# Patient Record
Sex: Female | Born: 1970 | Race: White | Hispanic: No | Smoking: Current every day smoker
Health system: Southern US, Community
[De-identification: ages and names within clinical notes are randomized; demographics above are authoritative.]

## PROBLEM LIST (undated history)

## (undated) DIAGNOSIS — Z8619 Personal history of other infectious and parasitic diseases: Secondary | ICD-10-CM

## (undated) HISTORY — DX: Personal history of other infectious and parasitic diseases: Z86.19

---

## 1998-05-16 HISTORY — PX: TUBAL LIGATION: SHX77

## 2008-05-16 HISTORY — PX: CHOLECYSTECTOMY: SHX55

## 2011-09-21 HISTORY — PX: NECK SURGERY: SHX720

## 2012-11-16 ENCOUNTER — Emergency Department: Payer: Self-pay | Admitting: Emergency Medicine

## 2012-11-16 DIAGNOSIS — I10 Essential (primary) hypertension: Secondary | ICD-10-CM | POA: Insufficient documentation

## 2012-11-16 DIAGNOSIS — R9431 Abnormal electrocardiogram [ECG] [EKG]: Secondary | ICD-10-CM | POA: Insufficient documentation

## 2012-11-17 LAB — DRUG SCREEN, URINE
Amphetamines, Ur Screen: NEGATIVE (ref ?–1000)
Barbiturates, Ur Screen: NEGATIVE (ref ?–200)
Benzodiazepine, Ur Scrn: NEGATIVE (ref ?–200)
Cannabinoid 50 Ng, Ur ~~LOC~~: NEGATIVE (ref ?–50)
Cocaine Metabolite,Ur ~~LOC~~: NEGATIVE (ref ?–300)
MDMA (Ecstasy)Ur Screen: NEGATIVE (ref ?–500)

## 2012-11-17 LAB — URINALYSIS, COMPLETE
Blood: NEGATIVE
Glucose,UR: NEGATIVE mg/dL (ref 0–75)
Nitrite: NEGATIVE
RBC,UR: 1 /HPF (ref 0–5)

## 2012-11-17 LAB — COMPREHENSIVE METABOLIC PANEL
Alkaline Phosphatase: 102 U/L (ref 50–136)
Anion Gap: 8 (ref 7–16)
BUN: 10 mg/dL (ref 7–18)
Bilirubin,Total: 0.2 mg/dL (ref 0.2–1.0)
Calcium, Total: 8.5 mg/dL (ref 8.5–10.1)
Chloride: 108 mmol/L — ABNORMAL HIGH (ref 98–107)
Creatinine: 0.69 mg/dL (ref 0.60–1.30)
EGFR (Non-African Amer.): >60
Glucose: 103 mg/dL — ABNORMAL HIGH (ref 65–99)
Osmolality: 281 (ref 275–301)
SGPT (ALT): 17 U/L (ref 12–78)
Sodium: 141 mmol/L (ref 136–145)
Total Protein: 6.9 g/dL (ref 6.4–8.2)

## 2012-11-17 LAB — CBC
HCT: 37.1 % (ref 35.0–47.0)
HGB: 12.9 g/dL (ref 12.0–16.0)
MCH: 34.2 pg — ABNORMAL HIGH (ref 26.0–34.0)
MCV: 98 fL (ref 80–100)
Platelet: 293 10*3/uL (ref 150–440)
RBC: 3.79 10*6/uL — ABNORMAL LOW (ref 3.80–5.20)
RDW: 13.9 % (ref 11.5–14.5)

## 2012-11-17 LAB — PREGNANCY, URINE: Pregnancy Test, Urine: NEGATIVE m[IU]/mL

## 2012-11-17 LAB — ETHANOL
Ethanol %: <0.003 % (ref 0.000–0.080)
Ethanol: <3 mg/dL

## 2012-11-17 LAB — TROPONIN I: Troponin-I: <0.02 ng/mL

## 2012-11-17 LAB — TSH: Thyroid Stimulating Horm: 0.956 u[IU]/mL

## 2012-12-12 ENCOUNTER — Ambulatory Visit (INDEPENDENT_AMBULATORY_CARE_PROVIDER_SITE_OTHER): Payer: Self-pay | Admitting: Cardiovascular Disease

## 2012-12-12 ENCOUNTER — Encounter: Payer: Self-pay | Admitting: Cardiovascular Disease

## 2012-12-12 VITALS — BP 168/98 | HR 85 | Ht 63.0 in | Wt 149.5 lb

## 2012-12-12 DIAGNOSIS — F411 Generalized anxiety disorder: Secondary | ICD-10-CM

## 2012-12-12 DIAGNOSIS — F419 Anxiety disorder, unspecified: Secondary | ICD-10-CM

## 2012-12-12 DIAGNOSIS — R079 Chest pain, unspecified: Secondary | ICD-10-CM

## 2012-12-12 DIAGNOSIS — R0602 Shortness of breath: Secondary | ICD-10-CM

## 2012-12-12 DIAGNOSIS — I10 Essential (primary) hypertension: Secondary | ICD-10-CM

## 2012-12-12 MED ORDER — LISINOPRIL 20 MG PO TABS: 20.0000 mg | ORAL_TABLET | Freq: Every day | ORAL | Status: DC

## 2012-12-12 MED ORDER — AMLODIPINE BESYLATE 10 MG PO TABS: 10.0000 mg | ORAL_TABLET | Freq: Every day | ORAL | Status: DC

## 2012-12-12 NOTE — Patient Instructions (Addendum)
Please start amlodipine 1/2 pill a day for 5 days If blood pressure continues to run high,  Increase to a full pill  If blood pressure continues to run high, Start lisinopril one a day Track you blood pressure numbers   Please call us if you have new issues that need to be addressed before your next appt.  Your physician wants you to follow-up in: 1 month.

## 2012-12-12 NOTE — Assessment & Plan Note (Addendum)
Atypical type chest pain. Likely from hypertension and underlying stress/anxiety Records were reviewed from [facility]

## 2012-12-12 NOTE — Progress Notes (Signed)
   Patient ID: Alyssa Fleming, female    DOB: [birth year], 42 y.o.   MRN: [id number]  HPI Comments: 42 year old woman who recently moved from [place] to take care of her sister who is disabled who presents to the clinic to establish care  She reports recent evaluation in the emergency room on 11/16/2012 for chest pain, numbness in the lips. She has significant anxiety and stress taking care of her family member. D-dimer was negative, cardiac enzymes negative, cocci negative, urinalysis negative, potassium 3.2 with normal basic metabolic panel, no alcohol, normal CBC and chest x-ray negative. CT scan of the head was essentially normal. She reports that her blood pressure continues to run very high. She's not on any medications. Blood pressure was never a problem before in [place] now has been a chronic issue. She reports systolic pressures in the 160-180 range on a regular basis.   She does not have any chest pain with exertion. She is concerned about the numbness around her mouth. Ativan and Xanax seems to help her symptoms. She does not have a primary care physician yet.  EKG shows normal sinus rhythm with rate 85 beats per minute, no significant ST or T wave changes     Outpatient Encounter Prescriptions as of 12/12/2012  Medication Sig Dispense Refill  . aspirin 81 MG tablet Take 81 mg by mouth daily.      Marland Kitchen ibuprofen (ADVIL,MOTRIN) 200 MG tablet Take 200 mg by mouth every 6 (six) hours as needed for pain.      Marland Kitchen LORazepam (ATIVAN) 0.5 MG tablet Take 0.5 mg by mouth every 8 (eight) hours.        Review of Systems  Constitutional: Negative.   HENT: Negative.   Eyes: Negative.   Respiratory: Positive for chest tightness.   Cardiovascular: Negative.   Gastrointestinal: Negative.   Musculoskeletal: Negative.   Skin: Negative.   Neurological: Negative.   Psychiatric/Behavioral: Positive for sleep disturbance. The patient is nervous/anxious.   All other systems reviewed and are  negative.    BP 168/98  Pulse 85  Ht 5\' 3"  (1.6 m)  Wt 149 lb 8 oz (67.813 kg)  BMI 26.49 kg/m2  Physical Exam  Nursing note and vitals reviewed. Constitutional: She is oriented to person, place, and time. She appears well-developed and well-nourished.  HENT:  Head: Normocephalic.  Nose: Nose normal.  Mouth/Throat: Oropharynx is clear and moist.  Eyes: Conjunctivae are normal. Pupils are equal, round, and reactive to light.  Neck: Normal range of motion. Neck supple. No JVD present.  Cardiovascular: Normal rate, regular rhythm, S1 normal, S2 normal, normal heart sounds and intact distal pulses.  Exam reveals no gallop and no friction rub.   No murmur heard. Pulmonary/Chest: Effort normal and breath sounds normal. No respiratory distress. She has no wheezes. She has no rales. She exhibits no tenderness.  Abdominal: Soft. Bowel sounds are normal. She exhibits no distension. There is no tenderness.  Musculoskeletal: Normal range of motion. She exhibits no edema and no tenderness.  Lymphadenopathy:    She has no cervical adenopathy.  Neurological: She is alert and oriented to person, place, and time. Coordination normal.  Skin: Skin is warm and dry. No rash noted. No erythema.  Psychiatric: She has a normal mood and affect. Her behavior is normal. Judgment and thought content normal.    Assessment and Plan

## 2012-12-12 NOTE — Assessment & Plan Note (Signed)
Blood pressure very elevated today and recently. Stress likely contributing. We'll start amlodipine 5 mg titrating up to 10 mg daily. She will check her blood pressure at home as she does have access to a blood pressure cuff. If he continues to run high, she will start lisinopril daily.

## 2012-12-12 NOTE — Assessment & Plan Note (Signed)
Significant stress and anxiety at home taking care of a disabled sister.

## 2013-01-14 ENCOUNTER — Ambulatory Visit (INDEPENDENT_AMBULATORY_CARE_PROVIDER_SITE_OTHER): Payer: Self-pay | Admitting: Cardiovascular Disease

## 2013-01-14 ENCOUNTER — Encounter: Payer: Self-pay | Admitting: Cardiovascular Disease

## 2013-01-14 VITALS — BP 140/92 | HR 82 | Ht 63.0 in | Wt 149.5 lb

## 2013-01-14 DIAGNOSIS — R5383 Other fatigue: Secondary | ICD-10-CM

## 2013-01-14 DIAGNOSIS — R5381 Other malaise: Secondary | ICD-10-CM

## 2013-01-14 DIAGNOSIS — IMO0001 Reserved for inherently not codable concepts without codable children: Secondary | ICD-10-CM

## 2013-01-14 DIAGNOSIS — R0602 Shortness of breath: Secondary | ICD-10-CM

## 2013-01-14 DIAGNOSIS — I10 Essential (primary) hypertension: Secondary | ICD-10-CM

## 2013-01-14 DIAGNOSIS — F411 Generalized anxiety disorder: Secondary | ICD-10-CM

## 2013-01-14 DIAGNOSIS — F419 Anxiety disorder, unspecified: Secondary | ICD-10-CM

## 2013-01-14 DIAGNOSIS — F17200 Nicotine dependence, unspecified, uncomplicated: Secondary | ICD-10-CM

## 2013-01-14 DIAGNOSIS — R079 Chest pain, unspecified: Secondary | ICD-10-CM

## 2013-01-14 MED ORDER — AMLODIPINE BESYLATE 10 MG PO TABS: 10.0000 mg | ORAL_TABLET | Freq: Every day | ORAL | Status: DC

## 2013-01-14 NOTE — Assessment & Plan Note (Signed)
Blood pressure seems to be better controlled on a half pill of amlodipine, sometimes needs extra dose if it runs high. We have suggested she try to treat her headache and neck pain as well as this could cause her blood pressure to run high.

## 2013-01-14 NOTE — Assessment & Plan Note (Signed)
Atypical episodes of chest pain. No further workup at this time

## 2013-01-14 NOTE — Progress Notes (Signed)
   Patient ID: Alyssa Fleming, female    DOB: 05/03/1971, 42 y.o.   MRN: [id number]  HPI Comments: 42 year old woman who recently moved from [place] to take care of her sister who is disabled who presents to the clinic to establish care  She had evaluation in the emergency room on 11/16/2012 for chest pain, numbness in the lips. She has significant anxiety and stress taking care of her family member. D-dimer was negative, cardiac enzymes negative, cocci negative, urinalysis negative, potassium 3.2 with normal basic metabolic panel, no alcohol, normal CBC and chest x-ray negative. CT scan of the head was essentially normal.   She was started on amlodipine for blood pressure, continues to take aspirin. She has neck pain, headaches that seem to push her blood pressure higher. Sometimes when she has a headache, she takes 3 amlodipine pills. No further significant chest pain episodes apart from atypical type symptoms at rest. She try to establish care with Dr. Sherrie Mustache. She reports that she has not been taking Ativan.  EKG shows normal sinus rhythm with rate 82beats per minute, no significant ST or T wave changes     Outpatient Encounter Prescriptions as of 01/14/2013  Medication Sig Dispense Refill  . acetaminophen (TYLENOL) 325 MG tablet Take 325 mg by mouth every 6 (six) hours as needed for pain.       Marland Kitchen amLODipine (NORVASC) 10 MG tablet Take 1 tablet (10 mg total) by mouth daily.sometimes takes a half pill   30 tablet  6  . aspirin 81 MG tablet Take 81 mg by mouth daily.        Review of Systems  Constitutional: Negative.   HENT: Negative.   Eyes: Negative.   Cardiovascular: Negative.   Gastrointestinal: Negative.   Musculoskeletal: Negative.   Skin: Negative.   Neurological: Negative.   Psychiatric/Behavioral: Positive for sleep disturbance. The patient is nervous/anxious.   All other systems reviewed and are negative.    BP 140/92  Pulse 82  Ht 5\' 3"  (1.6 m)  Wt 149 lb 8 oz (67.813  kg)  BMI 26.49 kg/m2  Physical Exam  Nursing note and vitals reviewed. Constitutional: She is oriented to person, place, and time. She appears well-developed and well-nourished.  HENT:  Head: Normocephalic.  Nose: Nose normal.  Mouth/Throat: Oropharynx is clear and moist.  Eyes: Conjunctivae are normal. Pupils are equal, round, and reactive to light.  Neck: Normal range of motion. Neck supple. No JVD present.  Cardiovascular: Normal rate, regular rhythm, S1 normal, S2 normal, normal heart sounds and intact distal pulses.  Exam reveals no gallop and no friction rub.   No murmur heard. Pulmonary/Chest: Effort normal and breath sounds normal. No respiratory distress. She has no wheezes. She has no rales. She exhibits no tenderness.  Abdominal: Soft. Bowel sounds are normal. She exhibits no distension. There is no tenderness.  Musculoskeletal: Normal range of motion. She exhibits no edema and no tenderness.  Lymphadenopathy:    She has no cervical adenopathy.  Neurological: She is alert and oriented to person, place, and time. Coordination normal.  Skin: Skin is warm and dry. No rash noted. No erythema.  Psychiatric: She has a normal mood and affect. Her behavior is normal. Judgment and thought content normal.    Assessment and Plan

## 2013-01-14 NOTE — Assessment & Plan Note (Signed)
History of anxiety by her report.

## 2013-01-14 NOTE — Patient Instructions (Addendum)
You are doing well. No medication changes were made.  Please call us if you have new issues that need to be addressed before your next appt.    

## 2013-01-14 NOTE — Assessment & Plan Note (Signed)
She smokes one quarter pack per day. We have encouraged her to continue to work on weaning her cigarettes and smoking cessation. She will continue to work on this and does not want any assistance with chantix.

## 2013-03-03 LAB — CBC AND DIFFERENTIAL
HCT: 40 % (ref 36–46)
Hemoglobin: 13.3 g/dL (ref 12.0–16.0)
Platelets: 431 10*3/uL — AB (ref 150–399)
WBC: 12.8 10^3/mL

## 2013-03-03 LAB — TSH: TSH: 0.89 u[IU]/mL (ref 0.41–5.90)

## 2013-03-03 LAB — HM PAP SMEAR: HM PAP: NEGATIVE

## 2013-10-30 ENCOUNTER — Emergency Department: Payer: Self-pay | Admitting: Emergency Medicine

## 2013-12-17 ENCOUNTER — Emergency Department: Payer: Self-pay | Admitting: Emergency Medicine

## 2013-12-17 LAB — COMPREHENSIVE METABOLIC PANEL
AST: 28 U/L (ref 15–37)
Albumin: 3.5 g/dL (ref 3.4–5.0)
Alkaline Phosphatase: 81 U/L
Anion Gap: 4 — ABNORMAL LOW (ref 7–16)
BUN: 8 mg/dL (ref 7–18)
Bilirubin,Total: 0.2 mg/dL (ref 0.2–1.0)
CALCIUM: 8.2 mg/dL — AB (ref 8.5–10.1)
Chloride: 110 mmol/L — ABNORMAL HIGH (ref 98–107)
Co2: 26 mmol/L (ref 21–32)
Creatinine: 0.87 mg/dL (ref 0.60–1.30)
EGFR (African American): >60
EGFR (Non-African Amer.): >60
Glucose: 108 mg/dL — ABNORMAL HIGH (ref 65–99)
OSMOLALITY: 278 (ref 275–301)
POTASSIUM: 3.9 mmol/L (ref 3.5–5.1)
SGPT (ALT): 16 U/L (ref 12–78)
SODIUM: 140 mmol/L (ref 136–145)
Total Protein: 7 g/dL (ref 6.4–8.2)

## 2013-12-17 LAB — CBC
HCT: 42.4 % (ref 35.0–47.0)
HGB: 14.3 g/dL (ref 12.0–16.0)
MCH: 34.1 pg — ABNORMAL HIGH (ref 26.0–34.0)
MCHC: 33.8 g/dL (ref 32.0–36.0)
MCV: 101 fL — ABNORMAL HIGH (ref 80–100)
PLATELETS: 309 10*3/uL (ref 150–440)
RBC: 4.2 10*6/uL (ref 3.80–5.20)
RDW: 13.9 % (ref 11.5–14.5)
WBC: 10.2 10*3/uL (ref 3.6–11.0)

## 2013-12-17 LAB — PRO B NATRIURETIC PEPTIDE: B-Type Natriuretic Peptide: 50 pg/mL (ref 0–125)

## 2013-12-17 LAB — TROPONIN I

## 2014-07-30 ENCOUNTER — Emergency Department: Payer: Self-pay | Admitting: Emergency Medicine

## 2014-07-30 LAB — URINALYSIS, COMPLETE
BILIRUBIN, UR: NEGATIVE
BLOOD: NEGATIVE
Bacteria: NONE SEEN
Glucose,UR: NEGATIVE mg/dL (ref 0–75)
Leukocyte Esterase: NEGATIVE
NITRITE: NEGATIVE
PH: 6 (ref 4.5–8.0)
Protein: NEGATIVE
RBC,UR: 1 /HPF (ref 0–5)
Specific Gravity: 1.009 (ref 1.003–1.030)
Squamous Epithelial: 2
WBC UR: 1 /HPF (ref 0–5)

## 2014-07-30 LAB — COMPREHENSIVE METABOLIC PANEL
ALK PHOS: 124 U/L — AB
ANION GAP: 6 — AB (ref 7–16)
Albumin: 4.1 g/dL (ref 3.4–5.0)
BILIRUBIN TOTAL: 0.3 mg/dL (ref 0.2–1.0)
BUN: 9 mg/dL (ref 7–18)
CHLORIDE: 106 mmol/L (ref 98–107)
CO2: 25 mmol/L (ref 21–32)
CREATININE: 0.72 mg/dL (ref 0.60–1.30)
Calcium, Total: 8.9 mg/dL (ref 8.5–10.1)
EGFR (African American): >60
EGFR (Non-African Amer.): >60
Glucose: 107 mg/dL — ABNORMAL HIGH (ref 65–99)
Osmolality: 273 (ref 275–301)
POTASSIUM: 4.7 mmol/L (ref 3.5–5.1)
SGOT(AST): 55 U/L — ABNORMAL HIGH (ref 15–37)
SGPT (ALT): 67 U/L — ABNORMAL HIGH
SODIUM: 137 mmol/L (ref 136–145)
Total Protein: 7.7 g/dL (ref 6.4–8.2)

## 2014-07-30 LAB — LIPASE, BLOOD: Lipase: 171 U/L (ref 73–393)

## 2014-07-30 LAB — CBC
HCT: 43.3 % (ref 35.0–47.0)
HGB: 14.2 g/dL (ref 12.0–16.0)
MCH: 33.3 pg (ref 26.0–34.0)
MCHC: 32.8 g/dL (ref 32.0–36.0)
MCV: 101 fL — AB (ref 80–100)
PLATELETS: 361 10*3/uL (ref 150–440)
RBC: 4.28 10*6/uL (ref 3.80–5.20)
RDW: 14 % (ref 11.5–14.5)
WBC: 11.7 10*3/uL — ABNORMAL HIGH (ref 3.6–11.0)

## 2014-12-17 ENCOUNTER — Other Ambulatory Visit: Payer: Self-pay | Admitting: Family Medicine

## 2014-12-23 ENCOUNTER — Other Ambulatory Visit: Payer: Self-pay | Admitting: Family Medicine

## 2014-12-23 NOTE — Telephone Encounter (Signed)
Pt contacted office for refill request on the following medications:Oxycodone 10-325mg .  [phone]

## 2014-12-24 MED ORDER — OXYCODONE-ACETAMINOPHEN 10-325 MG PO TABS: 1.0000 | ORAL_TABLET | Freq: Three times a day (TID) | ORAL | Status: DC | PRN

## 2015-01-18 ENCOUNTER — Other Ambulatory Visit: Payer: Self-pay | Admitting: Family Medicine

## 2015-01-18 DIAGNOSIS — G8929 Other chronic pain: Secondary | ICD-10-CM

## 2015-01-18 DIAGNOSIS — M542 Cervicalgia: Principal | ICD-10-CM

## 2015-01-18 MED ORDER — OXYCODONE-ACETAMINOPHEN 10-325 MG PO TABS: 1.0000 | ORAL_TABLET | Freq: Three times a day (TID) | ORAL | Status: DC | PRN

## 2015-01-18 NOTE — Telephone Encounter (Signed)
Pt contacted office for refill request on the following medications:  oxyCODONE-acetaminophen (PERCOCET) 10-325 MG.  ZO#[phone]/WJB#[phone]/MJ This is a Dr Sherrie MustacheFisher pt.

## 2015-01-18 NOTE — Telephone Encounter (Signed)
Last refill 12/24/2014. Allene DillonEmily Drozdowski, CMA

## 2015-01-18 NOTE — Telephone Encounter (Signed)
Prescription printed. Please notify patient it is ready for pick up. Thanks- Dr. Dajour Pierpoint.  

## 2015-01-26 ENCOUNTER — Emergency Department
Admission: EM | Admit: 2015-01-26 | Discharge: 2015-01-26 | Disposition: A | Discharge status: Home / Self Care | Payer: Self-pay | Attending: Emergency Medicine | Admitting: Emergency Medicine

## 2015-01-26 ENCOUNTER — Encounter: Payer: Self-pay | Admitting: Emergency Medicine

## 2015-01-26 DIAGNOSIS — M5416 Radiculopathy, lumbar region: Secondary | ICD-10-CM | POA: Insufficient documentation

## 2015-01-26 DIAGNOSIS — M62830 Muscle spasm of back: Secondary | ICD-10-CM

## 2015-01-26 DIAGNOSIS — Z79899 Other long term (current) drug therapy: Secondary | ICD-10-CM | POA: Insufficient documentation

## 2015-01-26 DIAGNOSIS — I10 Essential (primary) hypertension: Secondary | ICD-10-CM | POA: Insufficient documentation

## 2015-01-26 DIAGNOSIS — Z720 Tobacco use: Secondary | ICD-10-CM | POA: Insufficient documentation

## 2015-01-26 MED ORDER — PREDNISONE 10 MG PO TABS: ORAL_TABLET | ORAL | Status: DC

## 2015-01-26 MED ORDER — CYCLOBENZAPRINE HCL 5 MG PO TABS: 5.0000 mg | ORAL_TABLET | Freq: Three times a day (TID) | ORAL | Status: DC | PRN

## 2015-01-26 MED ORDER — DIAZEPAM 5 MG/ML IJ SOLN
5.0000 mg | Freq: Once | INTRAMUSCULAR | Status: AC
  Administered 2015-01-26: 5 mg via INTRAMUSCULAR
  Filled 2015-01-26: qty 2

## 2015-01-26 MED ORDER — DEXAMETHASONE SODIUM PHOSPHATE 10 MG/ML IJ SOLN
10.0000 mg | Freq: Once | INTRAMUSCULAR | Status: AC
  Administered 2015-01-26: 10 mg via INTRAMUSCULAR
  Filled 2015-01-26: qty 1

## 2015-01-26 NOTE — ED Notes (Signed)
Pt reports lower back pain x3 days; reports hx of lower back pain. Pt denies any injury. Pt denies any urinary symptoms.

## 2015-01-26 NOTE — ED Notes (Signed)
Pt. States chronic lower rt. Back pain.  Pt. States difficulty sitting.  Pt. States taking mobic today along with flexeril.  Pt. States no relief today.

## 2015-01-26 NOTE — Discharge Instructions (Signed)
Back Exercises Back exercises help treat and prevent back injuries. The goal of back exercises is to increase the strength of your abdominal and back muscles and the flexibility of your back. These exercises should be started when you no longer have back pain. Back exercises include:  Pelvic Tilt. Lie on your back with your knees bent. Tilt your pelvis until the lower part of your back is against the floor. Hold this position 5 to 10 sec and repeat 5 to 10 times.  Knee to Chest. Pull first 1 knee up against your chest and hold for 20 to 30 seconds, repeat this with the other knee, and then both knees. This may be done with the other leg straight or bent, whichever feels better.  Sit-Ups or Curl-Ups. Bend your knees 90 degrees. Start with tilting your pelvis, and do a partial, slow sit-up, lifting your trunk only 30 to 45 degrees off the floor. Take at least 2 to 3 seconds for each sit-up. Do not do sit-ups with your knees out straight. If partial sit-ups are difficult, simply do the above but with only tightening your abdominal muscles and holding it as directed.  Hip-Lift. Lie on your back with your knees flexed 90 degrees. Push down with your feet and shoulders as you raise your hips a couple inches off the floor; hold for 10 seconds, repeat 5 to 10 times.  Back arches. Lie on your stomach, propping yourself up on bent elbows. Slowly press on your hands, causing an arch in your low back. Repeat 3 to 5 times. Any initial stiffness and discomfort should lessen with repetition over time.  Shoulder-Lifts. Lie face down with arms beside your body. Keep hips and torso pressed to floor as you slowly lift your head and shoulders off the floor. Do not overdo your exercises, especially in the beginning. Exercises may cause you some mild back discomfort which lasts for a few minutes; however, if the pain is more severe, or lasts for more than 15 minutes, do not continue exercises until you see your caregiver.  Improvement with exercise therapy for back problems is slow.  See your caregivers for assistance with developing a proper back exercise program. Document Released: 08/09/2004 Document Revised: 09/24/2011 Document Reviewed: 05/03/2011 [facility] Patient Information 2015 [place], [place]. This information is not intended to replace advice given to you by your health care provider. Make sure you discuss any questions you have with your health care provider.  Lumbosacral Radiculopathy Lumbosacral radiculopathy is a pinched nerve or nerves in the low back (lumbosacral area). When this happens you may have weakness in your legs and may not be able to stand on your toes. You may have pain going down into your legs. There may be difficulties with walking normally. There are many causes of this problem. Sometimes this may happen from an injury, or simply from arthritis or boney problems. It may also be caused by other illnesses such as diabetes. If there is no improvement after treatment, further studies may be done to find the exact cause. DIAGNOSIS  X-rays may be needed if the problems become long standing. Electromyograms may be done. This study is one in which the working of nerves and muscles is studied. HOME CARE INSTRUCTIONS   Applications of ice packs may be helpful. Ice can be used in a plastic bag with a towel around it to prevent frostbite to skin. This may be used every 2 hours for 20 to 30 minutes, or as needed, while awake, or  as directed by your caregiver.  Only take over-the-counter or prescription medicines for pain, discomfort, or fever as directed by your caregiver.  If physical therapy was prescribed, follow your caregiver's directions. SEEK IMMEDIATE MEDICAL CARE IF:   You have pain not controlled with medications.  You seem to be getting worse rather than better.  You develop increasing weakness in your legs.  You develop loss of bowel or bladder control.  You have difficulty with  walking or balance, or develop clumsiness in the use of your legs.  You have a fever. MAKE SURE YOU:   Understand these instructions.  Will watch your condition.  Will get help right away if you are not doing well or get worse. Document Released: 07/02/2005 Document Revised: 09/24/2011 Document Reviewed: 02/20/2008 [facility] Patient Information 2015 [place]. This information is not intended to replace advice given to you by your health care provider. Make sure you discuss any questions you have with your health care provider.

## 2015-01-26 NOTE — ED Provider Notes (Signed)
CSN: [id number]     Arrival date & time 01/26/15  1825 History   First MD Initiated Contact with Patient 01/26/15 2106     Chief Complaint  Patient presents with  . Back Pain     (Consider location/radiation/quality/duration/timing/severity/associated sxs/prior Treatment) HPI  44 year old female presents today for evaluation of lower back pain muscle spasms with right-sided radicular symptoms. She's had pain numbness and tingling that is sharp radiating down her right posterior thigh to the knee. She has occasional numbness in the right foot. The symptoms of the present for the last 3-4 days and have been reoccurring every 4 weeks for the last 3 months. She denies any trauma or injury. Chronic oxycodone 10 mg for chronic neck pain. She has been taking meloxicam with minimal to no relief. Her pain is severe and increased with activity. She gets relief with rest. With walking she will have Itching and tightness in the right lower back. No weakness or loss of bowel or bladder symptoms.  Past Medical History  Diagnosis Date  . Hypertension   . Chronic bronchitis   . GERD (gastroesophageal reflux disease)    Past Surgical History  Procedure Laterality Date  . Cesarean section    . Tubal ligation    . Cholecystectomy    . Neck surgery  09/21/2011   Family History  Problem Relation Age of Onset  . Heart disease Mother   . Hypertension Mother   . Hypertension Sister    History  Substance Use Topics  . Smoking status: Current Every Day Smoker -- 1.00 packs/day for 20 years    Types: Cigarettes  . Smokeless tobacco: Not on file  . Alcohol Use: 0.5 oz/week    1 Standard drinks or equivalent per week     Comment: occasional   OB History    No data available     Review of Systems  Constitutional: Negative for fever, chills, activity change and fatigue.  HENT: Negative for congestion, sinus pressure and sore throat.   Eyes: Negative for visual disturbance.  Respiratory: Negative for  cough, chest tightness and shortness of breath.   Cardiovascular: Negative for chest pain and leg swelling.  Gastrointestinal: Negative for nausea, vomiting, abdominal pain and diarrhea.  Genitourinary: Negative for dysuria.  Musculoskeletal: Positive for back pain. Negative for arthralgias and gait problem.  Skin: Negative for rash.  Neurological: Positive for numbness. Negative for weakness and headaches.  Hematological: Negative for adenopathy.  Psychiatric/Behavioral: Negative for behavioral problems, confusion and agitation.      Allergies  Toradol and Tramadol  Home Medications   Prior to Admission medications   Medication Sig Start Date End Date Taking? Authorizing Provider  acetaminophen (TYLENOL) 325 MG tablet Take 325 mg by mouth every 6 (six) hours as needed for pain.     Historical Provider, MD  amLODipine (NORVASC) 10 MG tablet Take 1 tablet (10 mg total) by mouth daily. 01/14/13   Antonieta Iba, MD  aspirin 81 MG tablet Take 81 mg by mouth daily.    Historical Provider, MD  cyclobenzaprine (FLEXERIL) 5 MG tablet Take 1 tablet (5 mg total) by mouth every 8 (eight) hours as needed for muscle spasms. 01/26/15   Evon Slack, PA-C  lisinopril (PRINIVIL,ZESTRIL) 10 MG tablet take 1 tablet by mouth every evening 12/17/14   Malva Limes, MD  oxyCODONE-acetaminophen (PERCOCET) 10-325 MG per tablet Take 1 tablet by mouth every 8 (eight) hours as needed for pain. 01/18/15   Lorie Phenix, MD  predniSONE (DELTASONE) 10 MG tablet 10 day taper. 5,5,4,4,3,3,2,2,1,1 01/26/15   Evon Slackhomas C Natiya Seelinger, PA-C   BP 158/89 mmHg  Pulse 95  Temp(Src) 97.8 F (36.6 C) (Oral)  Resp 18  Ht 5\' 3"  (1.6 m)  Wt 145 lb (65.772 kg)  BMI 25.69 kg/m2  SpO2 100%  LMP 01/18/2015 (Approximate) Physical Exam  Constitutional: She is oriented to person, place, and time. She appears well-developed and well-nourished. No distress.  HENT:  Head: Normocephalic and atraumatic.  Mouth/Throat: Oropharynx is clear  and moist.  Eyes: EOM are normal. Pupils are equal, round, and reactive to light. Right eye exhibits no discharge. Left eye exhibits no discharge.  Neck: Normal range of motion. Neck supple.  Cardiovascular: Normal rate and intact distal pulses.   Pulmonary/Chest: Effort normal. No respiratory distress. She exhibits no tenderness.  Abdominal: Soft. Bowel sounds are normal. She exhibits no distension and no mass. There is no tenderness.  Musculoskeletal:  Patient able to stand with no significant discomfort. Examination of the lumbar spine shows patient has no spinous process tenderness. She has right paravertebral muscle tenderness of muscle spasm noted. She has limited flexion and extension of the lumbar spine. She has full range of motion of the hips knees and ankles. 5 out of 5 strength with hip flexion, knee flexion and extension, ankle plantarflexion dorsiflexion. Neurovascularly intact in bilateral lower extremity is. 2+ patellar reflexes bilaterally. normal sensation in bilateral lower extremities.  Neurological: She is alert and oriented to person, place, and time. She has normal reflexes.  Skin: Skin is warm and dry.  Psychiatric: She has a normal mood and affect. Her behavior is normal. Thought content normal.  Nursing note and vitals reviewed.   ED Course  Procedures (including critical care time) Labs Review Labs Reviewed - No data to display  Imaging Review No results found.   EKG Interpretation None      MDM   Final diagnoses:  Right lumbar radiculopathy  Lumbar paraspinal muscle spasm    44 year old female with acute on chronic right lumbar radiculopathy. No neurological deficits on exam. She is on chronic Percocet for her chronic neck pain. She was found to have significant muscle spasm present in the right lumbar spine. She was given injection of 5 mg of Valium IM followed by 10 mg of dexamethasone IM. She is given a prescription for 10 day prednisone taper as well  as Flexeril 5 mg 3 times a day. She will follow-up with PCP or Dr. Rosita Keamenz for evaluation in 5-7 days if no improvement of symptoms. He turned to the ER for any worsening symptoms or urgent changes in her health.    Evon Slackhomas C Morena Mckissack, PA-C 01/26/15 2125  Sharman CheekPhillip Stafford, MD 01/26/15 [phone]

## 2015-02-03 ENCOUNTER — Telehealth: Payer: Self-pay | Admitting: Family Medicine

## 2015-02-03 NOTE — Telephone Encounter (Signed)
She got a letter for jury duty.  She wants to know if you can write her a note due to some medication that she is on.  Please call her back at [phone] to let her know. This is a number at someone else home that she is at today.  Thanks, [employer]

## 2015-02-03 NOTE — Telephone Encounter (Signed)
Left message for pt to cb. Letter readyto pick-up.

## 2015-02-09 ENCOUNTER — Telehealth: Payer: Self-pay | Admitting: Family Medicine

## 2015-02-09 DIAGNOSIS — G8929 Other chronic pain: Secondary | ICD-10-CM

## 2015-02-09 DIAGNOSIS — M542 Cervicalgia: Principal | ICD-10-CM

## 2015-02-09 MED ORDER — OXYCODONE-ACETAMINOPHEN 10-325 MG PO TABS: 1.0000 | ORAL_TABLET | Freq: Three times a day (TID) | ORAL | Status: DC | PRN

## 2015-02-09 NOTE — Telephone Encounter (Signed)
Pt contacted office for refill request on the following medications:    oxyCODONE-acetaminophen (PERCOCET) 10-325 MG.  ZO#[phone]/WJ

## 2015-02-09 NOTE — Telephone Encounter (Signed)
Please review. Thanks!  

## 2015-03-09 ENCOUNTER — Other Ambulatory Visit: Payer: Self-pay | Admitting: General Practice

## 2015-03-09 DIAGNOSIS — M542 Cervicalgia: Principal | ICD-10-CM

## 2015-03-09 DIAGNOSIS — G8929 Other chronic pain: Secondary | ICD-10-CM

## 2015-03-09 MED ORDER — OXYCODONE-ACETAMINOPHEN 10-325 MG PO TABS: 1.0000 | ORAL_TABLET | Freq: Three times a day (TID) | ORAL | Status: DC | PRN

## 2015-03-09 NOTE — Telephone Encounter (Signed)
Pt contacted office for refill request on the following medications:  oxyCODONE-acetaminophen (PERCOCET) 10-325 MG.  CB#[phone]/MW ° °

## 2015-03-28 ENCOUNTER — Other Ambulatory Visit: Payer: Self-pay | Admitting: Family Medicine

## 2015-03-28 DIAGNOSIS — G8929 Other chronic pain: Secondary | ICD-10-CM

## 2015-03-28 DIAGNOSIS — M542 Cervicalgia: Principal | ICD-10-CM

## 2015-03-28 NOTE — Telephone Encounter (Signed)
Ok to refill? Please advise. Thanks!  

## 2015-03-28 NOTE — Telephone Encounter (Signed)
Pt contacted office for refill request on the following medications: cyclobenzaprine (FLEXERIL) 5 MG tablet & oxyCODONE-acetaminophen (PERCOCET) 10-325 MG per tablet. Pt stated that she would like to get her pain medication RX and date that she can fill it next week when it's due so that she doesn't have to call back next week to request it. Pharmacy: [facility] [address] Thanks TNP

## 2015-03-30 ENCOUNTER — Other Ambulatory Visit: Payer: Self-pay | Admitting: Family Medicine

## 2015-03-30 MED ORDER — OXYCODONE-ACETAMINOPHEN 10-325 MG PO TABS: 1.0000 | ORAL_TABLET | Freq: Three times a day (TID) | ORAL | Status: DC | PRN

## 2015-03-30 MED ORDER — CYCLOBENZAPRINE HCL 5 MG PO TABS: 5.0000 mg | ORAL_TABLET | Freq: Three times a day (TID) | ORAL | Status: DC | PRN

## 2015-04-01 ENCOUNTER — Other Ambulatory Visit: Payer: Self-pay | Admitting: Family Medicine

## 2015-04-19 ENCOUNTER — Other Ambulatory Visit: Payer: Self-pay | Admitting: Family Medicine

## 2015-04-19 DIAGNOSIS — M542 Cervicalgia: Principal | ICD-10-CM

## 2015-04-19 DIAGNOSIS — G8929 Other chronic pain: Secondary | ICD-10-CM

## 2015-04-19 MED ORDER — OXYCODONE-ACETAMINOPHEN 10-325 MG PO TABS: 1.0000 | ORAL_TABLET | Freq: Three times a day (TID) | ORAL | Status: DC | PRN

## 2015-04-19 NOTE — Telephone Encounter (Signed)
Pt needs refill oxyCODONE-acetaminophen (PERCOCET) 10-325 MG per tablet 03/30/15 -- Malva Limes, MD Take 1 tablet by mouth every 8 (eight) hours as needed for pain.  Call back is [phone]  Thanks Barth Kirks

## 2015-04-19 NOTE — Telephone Encounter (Signed)
Last filled on 03/30/15. Last OV was 06/09/15.

## 2015-05-05 DIAGNOSIS — Z889 Allergy status to unspecified drugs, medicaments and biological substances status: Secondary | ICD-10-CM | POA: Insufficient documentation

## 2015-05-05 DIAGNOSIS — R519 Headache, unspecified: Secondary | ICD-10-CM | POA: Insufficient documentation

## 2015-05-05 DIAGNOSIS — R51 Headache: Secondary | ICD-10-CM

## 2015-05-05 DIAGNOSIS — N938 Other specified abnormal uterine and vaginal bleeding: Secondary | ICD-10-CM | POA: Insufficient documentation

## 2015-05-05 DIAGNOSIS — R12 Heartburn: Secondary | ICD-10-CM | POA: Insufficient documentation

## 2015-05-06 ENCOUNTER — Ambulatory Visit (INDEPENDENT_AMBULATORY_CARE_PROVIDER_SITE_OTHER): Payer: Self-pay | Admitting: Family Medicine

## 2015-05-06 ENCOUNTER — Encounter: Payer: Self-pay | Admitting: Family Medicine

## 2015-05-06 ENCOUNTER — Telehealth: Payer: Self-pay | Admitting: Family Medicine

## 2015-05-06 VITALS — BP 120/62 | HR 110 | Temp 98.9°F | Resp 18 | Ht 64.0 in | Wt 135.0 lb

## 2015-05-06 DIAGNOSIS — G8929 Other chronic pain: Secondary | ICD-10-CM

## 2015-05-06 DIAGNOSIS — J329 Chronic sinusitis, unspecified: Secondary | ICD-10-CM | POA: Insufficient documentation

## 2015-05-06 DIAGNOSIS — M542 Cervicalgia: Secondary | ICD-10-CM

## 2015-05-06 DIAGNOSIS — J0100 Acute maxillary sinusitis, unspecified: Secondary | ICD-10-CM

## 2015-05-06 DIAGNOSIS — Z720 Tobacco use: Secondary | ICD-10-CM

## 2015-05-06 DIAGNOSIS — I10 Essential (primary) hypertension: Secondary | ICD-10-CM

## 2015-05-06 DIAGNOSIS — F17200 Nicotine dependence, unspecified, uncomplicated: Secondary | ICD-10-CM

## 2015-05-06 MED ORDER — OXYCODONE-ACETAMINOPHEN 10-325 MG PO TABS: 1.0000 | ORAL_TABLET | Freq: Three times a day (TID) | ORAL | Status: DC | PRN

## 2015-05-06 MED ORDER — MELOXICAM 15 MG PO TABS: 15.0000 mg | ORAL_TABLET | Freq: Every day | ORAL | Status: DC | PRN

## 2015-05-06 MED ORDER — CYCLOBENZAPRINE HCL 5 MG PO TABS: 5.0000 mg | ORAL_TABLET | Freq: Three times a day (TID) | ORAL | Status: AC | PRN

## 2015-05-06 MED ORDER — AMLODIPINE BESYLATE 10 MG PO TABS: 5.0000 mg | ORAL_TABLET | Freq: Every day | ORAL | Status: DC

## 2015-05-06 MED ORDER — AZITHROMYCIN 250 MG PO TABS: ORAL_TABLET | ORAL | Status: AC

## 2015-05-06 NOTE — Telephone Encounter (Signed)
Will send in refills today. Does she want to continue breaking amlopidine in half, or would she rather change to a 5mg  tablet.

## 2015-05-06 NOTE — Patient Instructions (Signed)
Smoking Cessation, Tips for Success If you are ready to quit smoking, congratulations! You have chosen to help yourself be healthier. Cigarettes bring nicotine, tar, carbon monoxide, and other irritants into your body. Your lungs, heart, and blood vessels will be able to work better without these poisons. There are many different ways to quit smoking. Nicotine gum, nicotine patches, a nicotine inhaler, or nicotine nasal spray can help with physical craving. Hypnosis, support groups, and medicines help break the habit of smoking. WHAT THINGS CAN I DO TO MAKE QUITTING EASIER?  Here are some tips to help you quit for good:  Pick a date when you will quit smoking completely. Tell all of your friends and family about your plan to quit on that date.  Do not try to slowly cut down on the number of cigarettes you are smoking. Pick a quit date and quit smoking completely starting on that day.  Throw away all cigarettes.   Clean and remove all ashtrays from your home, work, and car.  On a card, write down your reasons for quitting. Carry the card with you and read it when you get the urge to smoke.  Cleanse your body of nicotine. Drink enough water and fluids to keep your urine clear or pale yellow. Do this after quitting to flush the nicotine from your body.  Learn to predict your moods. Do not let a bad situation be your excuse to have a cigarette. Some situations in your life might tempt you into wanting a cigarette.  Never have "just one" cigarette. It leads to wanting another and another. Remind yourself of your decision to quit.  Change habits associated with smoking. If you smoked while driving or when feeling stressed, try other activities to replace smoking. Stand up when drinking your coffee. Brush your teeth after eating. Sit in a different chair when you read the paper. Avoid alcohol while trying to quit, and try to drink fewer caffeinated beverages. Alcohol and caffeine may urge you to  smoke.  Avoid foods and drinks that can trigger a desire to smoke, such as sugary or spicy foods and alcohol.  Ask people who smoke not to smoke around you.  Have something planned to do right after eating or having a cup of coffee. For example, plan to take a walk or exercise.  Try a relaxation exercise to calm you down and decrease your stress. Remember, you may be tense and nervous for the first 2 weeks after you quit, but this will pass.  Find new activities to keep your hands busy. Play with a pen, coin, or rubber band. Doodle or draw things on paper.  Brush your teeth right after eating. This will help cut down on the craving for the taste of tobacco after meals. You can also try mouthwash.   Use oral substitutes in place of cigarettes. Try using lemon drops, carrots, cinnamon sticks, or chewing gum. Keep them handy so they are available when you have the urge to smoke.  When you have the urge to smoke, try deep breathing.  Designate your home as a nonsmoking area.  If you are a heavy smoker, ask your health care provider about a prescription for nicotine chewing gum. It can ease your withdrawal from nicotine.  Reward yourself. Set aside the cigarette money you save and buy yourself something nice.  Look for support from others. Join a support group or smoking cessation program. Ask someone at home or at work to help you with your plan   to quit smoking.  Always ask yourself, "Do I need this cigarette or is this just a reflex?" Tell yourself, "Today, I choose not to smoke," or "I do not want to smoke." You are reminding yourself of your decision to quit.  Do not replace cigarette smoking with electronic cigarettes (commonly called e-cigarettes). The safety of e-cigarettes is unknown, and some may contain harmful chemicals.  If you relapse, do not give up! Plan ahead and think about what you will do the next time you get the urge to smoke. HOW WILL I FEEL WHEN I QUIT SMOKING? You  may have symptoms of withdrawal because your body is used to nicotine (the addictive substance in cigarettes). You may crave cigarettes, be irritable, feel very hungry, cough often, get headaches, or have difficulty concentrating. The withdrawal symptoms are only temporary. They are strongest when you first quit but will go away within 10-14 days. When withdrawal symptoms occur, stay in control. Think about your reasons for quitting. Remind yourself that these are signs that your body is healing and getting used to being without cigarettes. Remember that withdrawal symptoms are easier to treat than the major diseases that smoking can cause.  Even after the withdrawal is over, expect periodic urges to smoke. However, these cravings are generally short lived and will go away whether you smoke or not. Do not smoke! WHAT RESOURCES ARE AVAILABLE TO HELP ME QUIT SMOKING? Your health care provider can direct you to community resources or hospitals for support, which may include:  Group support.  Education.  Hypnosis.  Therapy.   This information is not intended to replace advice given to you by your health care provider. Make sure you discuss any questions you have with your health care provider.   Document Released: 03/30/2004 Document Revised: 07/23/2014 Document Reviewed: 12/18/2012 Elsevier Interactive Patient Education 2016 [employer] Inc.  

## 2015-05-06 NOTE — Progress Notes (Signed)
Patient: Alyssa Fleming Female    DOB: 04/11/1971   44 y.o.   MRN: [id number] Visit Date: 05/06/2015  Today's Provider: Mila Merryonald Katina Remick, MD   Chief Complaint  Patient presents with  . Hypertension    follow up  . Neck Pain    follow up   Subjective:    HPI   Hypertension, follow-up:  BP Readings from Last 3 Encounters:  05/06/15 120/62  01/26/15 156/98  01/14/13 140/92    She was last seen for hypertension 11 months ago.  BP at that visit was 142/90. Management since that visit includes no changes. She reports good compliance with treatment. She is not having side effects.  She is exercising. She is adherent to low salt diet.   Outside blood pressures are 130's/80's. She is experiencing none.  Patient denies chest pain, chest pressure/discomfort, claudication, dyspnea, exertional chest pressure/discomfort, fatigue, irregular heart beat, lower extremity edema, near-syncope, orthopnea, palpitations, paroxysmal nocturnal dyspnea, syncope and tachypnea.   Cardiovascular risk factors include hypertension and smoking/ tobacco exposure.  Use of agents associated with hypertension: none.     Weight trend: decreasing steadily. Lost 10 pounds since her last office visit  Wt Readings from Last 3 Encounters:  01/26/15 145 lb (65.772 kg)  01/14/13 149 lb 8 oz (67.813 kg)  12/12/12 149 lb 8 oz (67.813 kg)    Current diet: in general, a "healthy" diet    ------------------------------------------------------------------------  Chronic Neck Pain follow up: Last visit was several months ago. Changes made since that visit includes starting Meloxicam 15mg  daily as needed in addition to Cyclobenzaprine and Oxycodone/apap. Patient comes in today reporting that her neck pain has worsened. Patient states she has been taking Meloxicam and it has helped with the neck pain but she has been out of medication for several weeks. She would like to get back on meloxicam  And needs refill  for oxycodone/apap to take only as needed.     Allergies  Allergen Reactions  . Toradol [Ketorolac Tromethamine]     Nausea   . Tramadol     Stomach burning  . Amoxicillin Rash   Previous Medications   ACETAMINOPHEN (TYLENOL) 325 MG TABLET    Take 325 mg by mouth every 6 (six) hours as needed for pain.    AMLODIPINE (NORVASC) 10 MG TABLET    Take 1 tablet (10 mg total) by mouth daily.   CYCLOBENZAPRINE (FLEXERIL) 5 MG TABLET    Take 1 tablet (5 mg total) by mouth every 8 (eight) hours as needed for muscle spasms.   LISINOPRIL (PRINIVIL,ZESTRIL) 10 MG TABLET    take 1 tablet by mouth every evening   MELOXICAM (MOBIC) 15 MG TABLET    Take 1 tablet by mouth daily as needed.   OXYCODONE-ACETAMINOPHEN (PERCOCET) 10-325 MG TABLET    Take 1 tablet by mouth every 8 (eight) hours as needed for pain.    Review of Systems  Constitutional: Positive for fever (1 week ago; now resolved). Negative for chills, appetite change and fatigue.  HENT: Positive for congestion, rhinorrhea, sinus pressure, sore throat and voice change. Negative for ear pain.   Respiratory: Positive for cough. Negative for chest tightness, shortness of breath and wheezing.   Cardiovascular: Negative for chest pain and palpitations.  Gastrointestinal: Negative for nausea, vomiting and abdominal pain.  Musculoskeletal: Positive for back pain (lower back) and neck pain.  Neurological: Negative for dizziness and weakness.    Social History  Substance Use Topics  .  Smoking status: Current Every Day Smoker -- 0.50 packs/day for 20 years    Types: Cigarettes  . Smokeless tobacco: Not on file  . Alcohol Use: No   Objective:   BP 120/62 mmHg  Pulse 110  Temp(Src) 98.9 F (37.2 C) (Oral)  Resp 18  Ht  (1.626 m)  Wt 135 lb (61.236 kg)  BMI 23.16 kg/m2  SpO2 96%  Physical Exam   General Appearance:    Alert, cooperative, no distress  HEENT:   Nasal congestion with green nasal discharge. Tender frontal and  maxillary sinuses.   Eyes:    PERRL, conjunctiva/corneas clear, EOM's intact       Lungs:     Clear to auscultation bilaterally, respirations unlabored  Heart:    Regular rate and rhythm  Neurologic:   Awake, alert, oriented x 3. No apparent focal neurological           defect.          Assessment & Plan:     1. Chronic neck pain Will controlled on current regiment - meloxicam (MOBIC) 15 MG tablet; Take 1 tablet (15 mg total) by mouth daily as needed.  Dispense: 30 tablet; Refill: 6 - cyclobenzaprine (FLEXERIL) 5 MG tablet; Take 1-2 tablets (5-10 mg total) by mouth every 8 (eight) hours as needed for muscle spasms.  Dispense: 60 tablet; Refill: 1 - oxyCODONE-acetaminophen (PERCOCET) 10-325 MG tablet; Take 1 tablet by mouth every 8 (eight) hours as needed for pain.  Dispense: 30 tablet; Refill: 0  2. Essential hypertension Well controlled. . - Renal function panel  3. Smoking Counseled to stop smoking  4. Acute maxillary sinusitis, recurrence not specified  - azithromycin (ZITHROMAX) 250 MG tablet; 2 by mouth today, then 1 daily for 4 days  Dispense: 6 tablet; Refill: 0        Mila Merry, MD  [facility]

## 2015-05-06 NOTE — Telephone Encounter (Signed)
Patient prefers to break the 10 mg in half.

## 2015-05-06 NOTE — Telephone Encounter (Signed)
Pt stated that she was in the office this morning and doesn't thinks Dr. Sherrie MustacheFisher heard her say that she also needs a refill on lisinopril (PRINIVIL,ZESTRIL) 10 MG tablet. Pt stated that she went to pick up her medication but this was the only one that wasn't sent to [facility] [address]. Pt requested this to be sent today b/c she will run out this Sunday 05/08/15. Thanks TNP

## 2015-05-06 NOTE — Telephone Encounter (Signed)
Dr. Sherrie MustacheFisher, were you wanting to wait until results came back from renal panel before refilling medication? Patient states she will run out of medication on Sunday.

## 2015-05-09 ENCOUNTER — Encounter: Payer: Self-pay | Admitting: Family Medicine

## 2015-05-17 ENCOUNTER — Other Ambulatory Visit: Payer: Self-pay | Admitting: Family Medicine

## 2015-05-19 ENCOUNTER — Telehealth: Payer: Self-pay | Admitting: Family Medicine

## 2015-05-19 DIAGNOSIS — G8929 Other chronic pain: Secondary | ICD-10-CM

## 2015-05-19 DIAGNOSIS — M542 Cervicalgia: Principal | ICD-10-CM

## 2015-05-19 NOTE — Telephone Encounter (Signed)
Pt contacted office for refill request on the following medications:  oxyCODONE-acetaminophen (PERCOCET) 10-325 MG.  OZ#[phone]/QVCB#[phone]/MW

## 2015-05-20 NOTE — Telephone Encounter (Signed)
Patient reports that she needs to take medication 3 times a day. She reports that she is taking medication on how it is written. She reports that she doesn't take any more than 3 tablets. Patient reports that she discussed this with you when she came into the office on 05/06/15. Patient reports that it is the same constant pain that she has always had.Patient reports that she always request medication a few day earlier than when it needs to be filled.  Patient is going out of town this weekend and she reports that she will need medication.

## 2015-05-20 NOTE — Telephone Encounter (Signed)
This was just filled on 05-06-15, and 04-19-2015. She is only supposed to be taking 30 tablets in a month.  If her pain is getting worse then she needs referral back to pain clinic.

## 2015-05-20 NOTE — Telephone Encounter (Signed)
Pt called to see if RX for pain medication was ready. Thanks TNP °

## 2015-05-27 ENCOUNTER — Other Ambulatory Visit: Payer: Self-pay | Admitting: Family Medicine

## 2015-05-27 DIAGNOSIS — M542 Cervicalgia: Principal | ICD-10-CM

## 2015-05-27 DIAGNOSIS — G8929 Other chronic pain: Secondary | ICD-10-CM

## 2015-05-27 MED ORDER — OXYCODONE-ACETAMINOPHEN 10-325 MG PO TABS: ORAL_TABLET | ORAL | Status: DC

## 2015-05-27 NOTE — Telephone Encounter (Signed)
Pt is calling back to see if the Rx for her pain medication is ready to be picked up.  AV#[phone]/WGCB#[phone]/MW

## 2015-06-10 IMAGING — CT CT ABD-PELV W/ CM
2 of 5 series · 16 of 46 positions shown, 18 images · IV contrast (omnipaque)
Comparison: None.

CLINICAL DATA: Acute onset of upper abdominal pain. Initial
encounter.

EXAM:
CT ABDOMEN AND PELVIS WITH CONTRAST
TECHNIQUE: Multidetector CT imaging of the abdomen and pelvis was performed
using the standard protocol following bolus administration of
intravenous contrast.
CONTRAST:  100 mL of Omnipaque 300 IV contrast

[Series 2: routine abd pel with · axial · 0.70mm/px · z∈[-474,-99]mm · 13 of 85 slices shown, 15 images]
[im 5/85  soft-tissue]
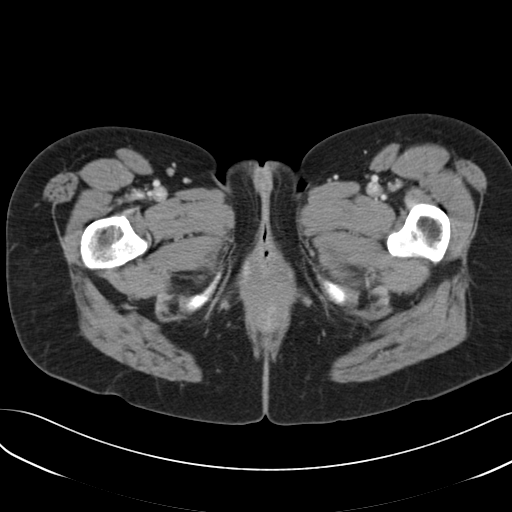
[im 5/85  bone]
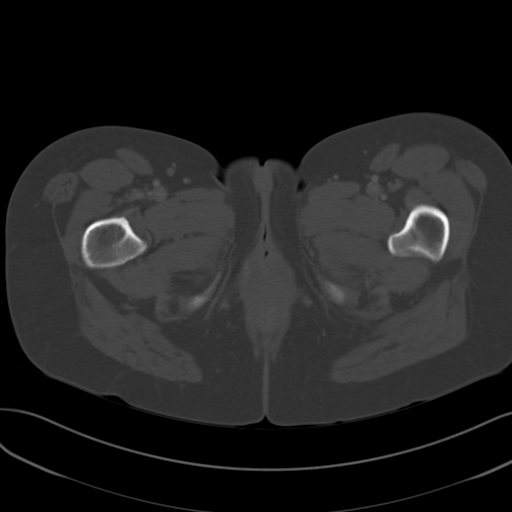
[im 14/85  soft-tissue]
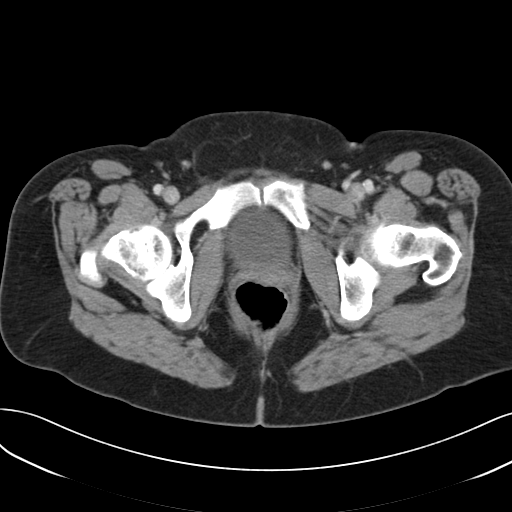
[im 18/85  soft-tissue]
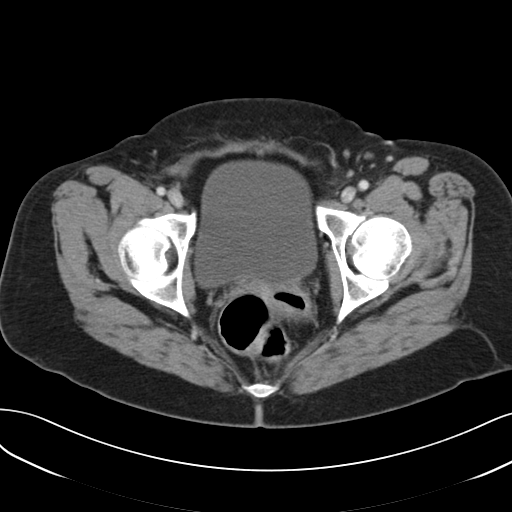
[im 23/85  soft-tissue]
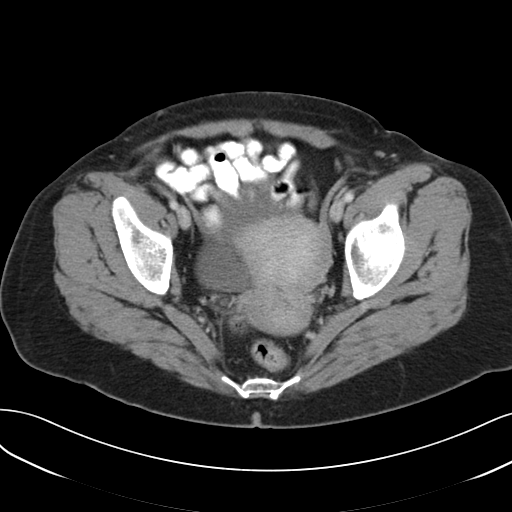
[im 31/85  soft-tissue]
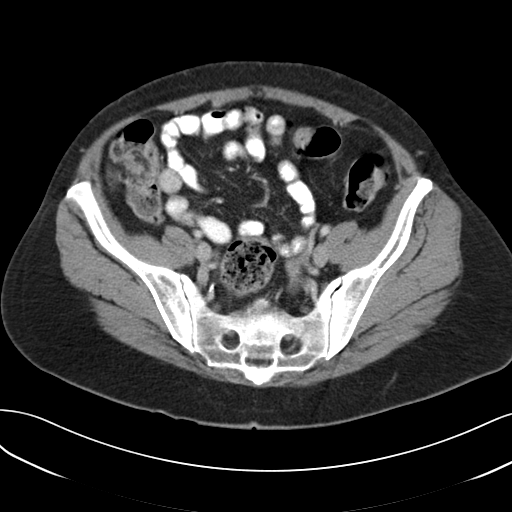
[im 36/85  soft-tissue]
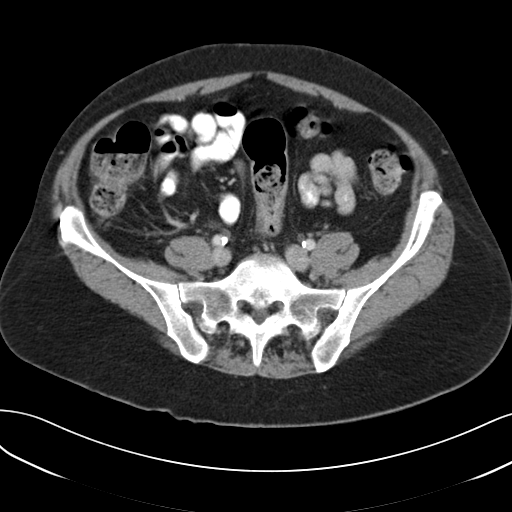
[im 45/85  soft-tissue]
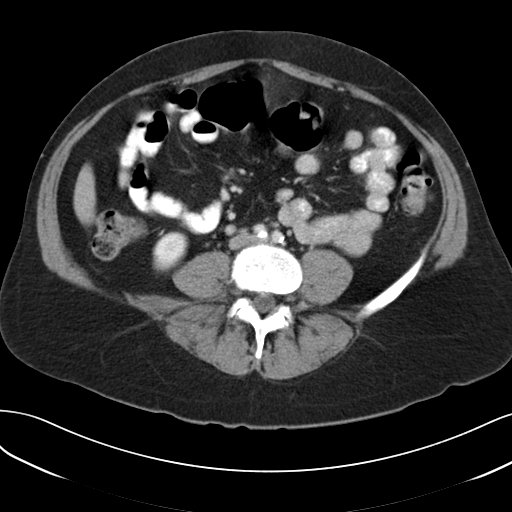
[im 49/85  soft-tissue]
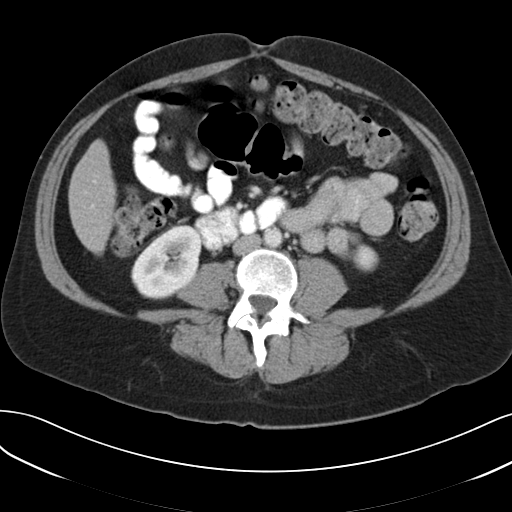
[im 54/85  soft-tissue]
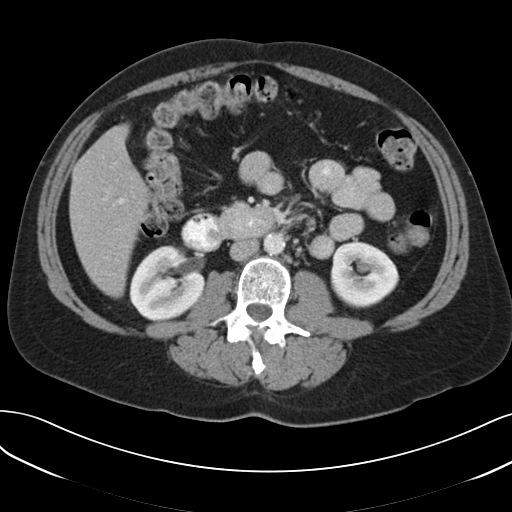
[im 54/85  bone]
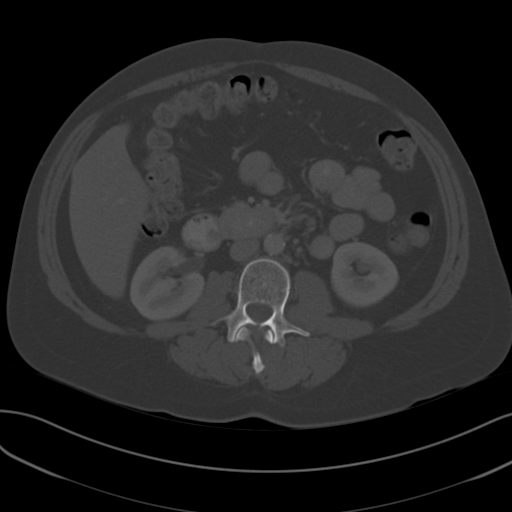
[im 62/85  soft-tissue]
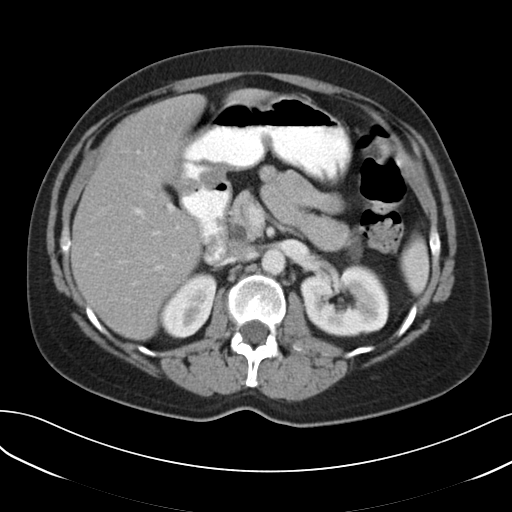
[im 67/85  soft-tissue]
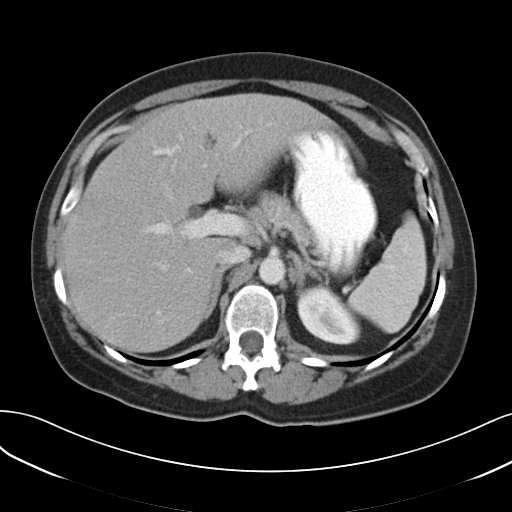
[im 71/85  soft-tissue]
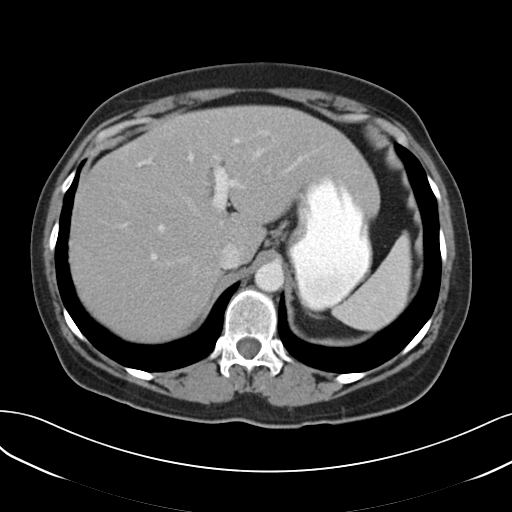
[im 80/85  soft-tissue]
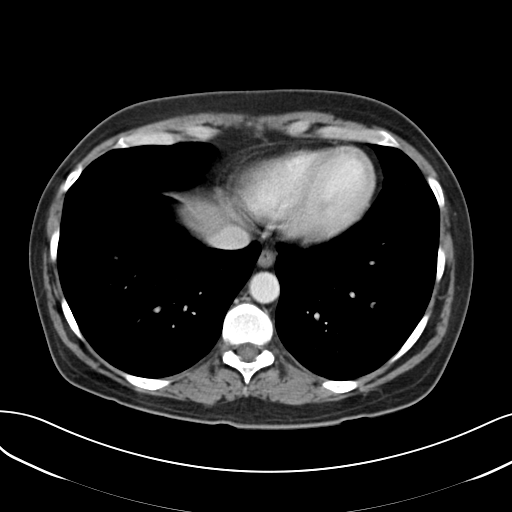

[Series 6: cor routine abd pel with · coronal · 0.89mm/px · 3 of 136 slices shown]
[im 46/136  soft-tissue]
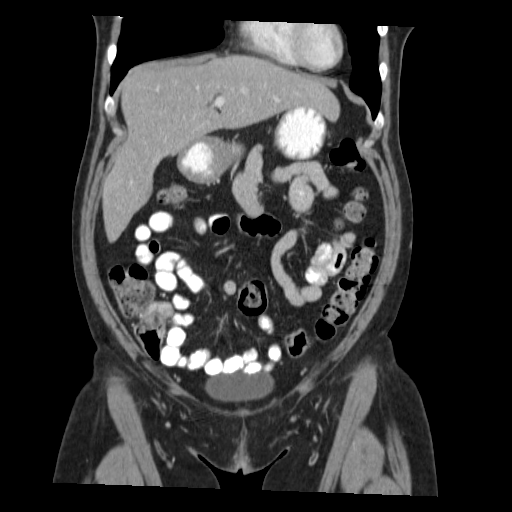
[im 61/136  soft-tissue]
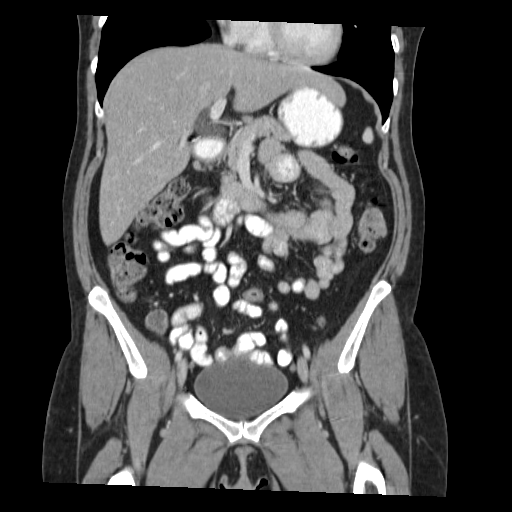
[im 76/136  soft-tissue]
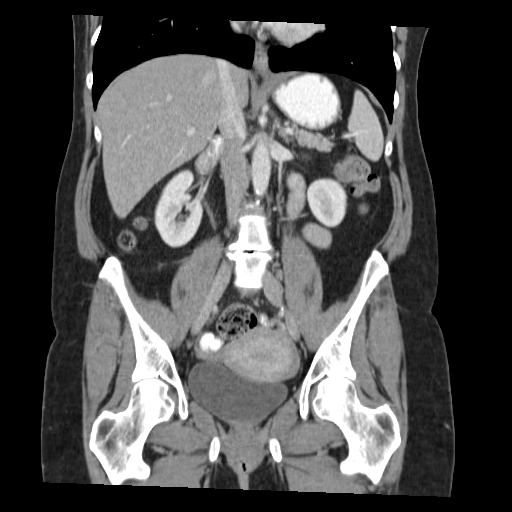

[16 of 46 positions shown; findings below may reference images not displayed]

FINDINGS: The visualized lung bases are clear.

The liver and spleen are unremarkable in appearance. The patient is
status post cholecystectomy, with clips noted along the gallbladder
fossa. Prominence of the intrahepatic biliary ducts remains within
normal limits status post cholecystectomy. The pancreas and adrenal
glands are unremarkable.

The kidneys are unremarkable in appearance. There is no evidence of
hydronephrosis. No renal or ureteral stones are seen. No perinephric
stranding is appreciated.

No free fluid is identified. The small bowel is unremarkable in
appearance. The stomach is within normal limits. No acute vascular
abnormalities are seen. Scattered calcification is seen along the
distal abdominal aorta and its branches, advanced for age.

The appendix is diminutive and grossly unremarkable; there is no
evidence for appendicitis. The colon is unremarkable in appearance.

The bladder is mildly distended and grossly unremarkable. The uterus
is within normal limits. The ovaries are relatively symmetric. No
suspicious adnexal masses are seen. No inguinal lymphadenopathy is
seen.

No acute osseous abnormalities are identified. Vacuum phenomenon is
noted at L4-L5, with an associated partially calcified disc
protrusion. This is a relatively common level for asymptomatic
calcified disc protrusion, and depending on the clinical situation,
can be considered an incidental finding. There is no evidence of
impression on exiting nerve roots.
IMPRESSION: 1. No acute abnormality seen within the abdomen or pelvis.
2. Scattered calcification along the distal abdominal aorta and its
branches, advanced for age.

## 2015-06-15 ENCOUNTER — Other Ambulatory Visit: Payer: Self-pay | Admitting: Family Medicine

## 2015-06-15 DIAGNOSIS — M542 Cervicalgia: Principal | ICD-10-CM

## 2015-06-15 DIAGNOSIS — G8929 Other chronic pain: Secondary | ICD-10-CM

## 2015-06-15 NOTE — Telephone Encounter (Signed)
Pt contacted office for refill request on the following medications:  oxyCODONE-acetaminophen (PERCOCET) 10-325 MG tablet.  ZO#[phone]/WJCB#[phone]/MW

## 2015-06-16 MED ORDER — OXYCODONE-ACETAMINOPHEN 10-325 MG PO TABS: ORAL_TABLET | ORAL | Status: DC

## 2015-07-05 ENCOUNTER — Telehealth: Payer: Self-pay | Admitting: Family Medicine

## 2015-07-05 DIAGNOSIS — M542 Cervicalgia: Principal | ICD-10-CM

## 2015-07-05 DIAGNOSIS — G8929 Other chronic pain: Secondary | ICD-10-CM

## 2015-07-05 DIAGNOSIS — N926 Irregular menstruation, unspecified: Secondary | ICD-10-CM

## 2015-07-05 NOTE — Telephone Encounter (Signed)
Patient reports that her periods are still irregular. She reports that her period has been on since Dec 1st. She reports that her period before then was on Nov 15th and lasted 3-5 days. She reports that her period is light to moderate flow. She thinks that she may need to start back on birth control pills. She is also requesting a refill on Oxycodone. Patient's contact number is correct. Thanks!

## 2015-07-05 NOTE — Telephone Encounter (Signed)
Pt called saying she has been having problems with her periods again.,  She started Dec 1 and has been having her cycle on and off since then.  She would like for some one to call her back.  She is wondering if she needs to be on the pill again,  Her call back is  [phone]  She also needs a refill on her pain medication.  oxyCODONE-acetaminophen (PERCOCET) 10-325 MG tablet 06/16/15 -- Malva Limesonald E Fisher, MD 1 tablet once or twice a day as needed for severe pain   Thanks,  Barth Kirkseri

## 2015-07-06 ENCOUNTER — Telehealth: Payer: Self-pay | Admitting: Family Medicine

## 2015-07-06 MED ORDER — OXYCODONE-ACETAMINOPHEN 10-325 MG PO TABS: ORAL_TABLET | ORAL | Status: DC

## 2015-07-06 NOTE — Telephone Encounter (Signed)
Patient was notified. Patient is agreeable to referral. Alyssa Fleming will you please schedule this. Patient wanted to stress that she is self-pay. Thank you!

## 2015-07-06 NOTE — Telephone Encounter (Signed)
Please advise 

## 2015-07-06 NOTE — Telephone Encounter (Signed)
Patient returning call CB#[phone]

## 2015-07-06 NOTE — Telephone Encounter (Signed)
Pt has been on her period for 3 weeks and states that she is starting to feel fatigued and having some nausea.Is there anything over the counter she can try ? Phone [phone]

## 2015-07-06 NOTE — Telephone Encounter (Signed)
She needs referral to OB/Gyn. She should not stay on birth control periods because they increase risk for heart disease and stroke in smokers over age of 43.   Ok to enter order for gyn referral if she wants to proceed with this.

## 2015-07-06 NOTE — Telephone Encounter (Signed)
Left message for pt to return call.

## 2015-07-07 NOTE — Telephone Encounter (Signed)
Take OTC iron sulfate 325mg  twice a day. This might make her constipated, if it does she should take OTC Milk of Magnesium twice a day.  Does she have any appointment with Gyn set up yet?

## 2015-07-07 NOTE — Telephone Encounter (Signed)
Unable to reach pt and unable to leave a message. Will try again later.

## 2015-07-08 NOTE — Telephone Encounter (Signed)
Unable to reach pt and unable to leave a message. Will try again later.  

## 2015-07-14 NOTE — Telephone Encounter (Signed)
Unable to reach pt and unable to leave a message. Will try again later.  

## 2015-07-26 NOTE — Telephone Encounter (Signed)
Unable to reach patient. Will save message to chart after several attempts to contact patient.

## 2015-07-27 ENCOUNTER — Other Ambulatory Visit: Payer: Self-pay | Admitting: Family Medicine

## 2015-07-27 DIAGNOSIS — G8929 Other chronic pain: Secondary | ICD-10-CM

## 2015-07-27 DIAGNOSIS — M542 Cervicalgia: Principal | ICD-10-CM

## 2015-07-27 NOTE — Telephone Encounter (Signed)
Pt needs refill oxyCODONE-acetaminophen (PERCOCET) 10-325 MG tablet 07/06/15 -- Malva Limesonald E Fisher, MD 1 tablet once or twice a day as needed for severe pain  Call back is [phone]  Thanks Barth Kirkseri

## 2015-07-28 MED ORDER — OXYCODONE-ACETAMINOPHEN 10-325 MG PO TABS: ORAL_TABLET | ORAL | Status: DC

## 2015-08-15 ENCOUNTER — Other Ambulatory Visit: Payer: Self-pay

## 2015-08-15 DIAGNOSIS — G8929 Other chronic pain: Secondary | ICD-10-CM

## 2015-08-15 DIAGNOSIS — M542 Cervicalgia: Principal | ICD-10-CM

## 2015-08-15 MED ORDER — OXYCODONE-ACETAMINOPHEN 10-325 MG PO TABS: ORAL_TABLET | ORAL | Status: DC

## 2015-08-15 NOTE — Telephone Encounter (Signed)
Fayrene Fearing called Requesting for medication Refill on the   oxyCODONE-acetaminophen (PERCOCET) 10-325 MG     For patient Alyssa Fleming.  Thanks,  -Joseline

## 2015-09-05 ENCOUNTER — Other Ambulatory Visit: Payer: Self-pay | Admitting: Family Medicine

## 2015-09-05 DIAGNOSIS — M542 Cervicalgia: Principal | ICD-10-CM

## 2015-09-05 DIAGNOSIS — G8929 Other chronic pain: Secondary | ICD-10-CM

## 2015-09-05 MED ORDER — OXYCODONE-ACETAMINOPHEN 10-325 MG PO TABS: ORAL_TABLET | ORAL | Status: DC

## 2015-09-05 NOTE — Telephone Encounter (Signed)
Pt contacted office for refill request on the following medications:  oxyCODONE-acetaminophen (PERCOCET) 10-325 MG tablet.  CB#[phone]/MW ° °

## 2015-09-13 ENCOUNTER — Other Ambulatory Visit: Payer: Self-pay | Admitting: Family Medicine

## 2015-09-22 ENCOUNTER — Telehealth: Payer: Self-pay | Admitting: Family Medicine

## 2015-09-22 DIAGNOSIS — M542 Cervicalgia: Principal | ICD-10-CM

## 2015-09-22 DIAGNOSIS — G8929 Other chronic pain: Secondary | ICD-10-CM

## 2015-09-22 NOTE — Telephone Encounter (Signed)
Pt contacted office for refill request on the following medications:  oxyCODONE-acetaminophen (PERCOCET) 10-325 MG tablet.  CB#[phone]/MW ° °

## 2015-09-23 MED ORDER — OXYCODONE-ACETAMINOPHEN 10-325 MG PO TABS: ORAL_TABLET | ORAL | Status: DC

## 2015-10-06 ENCOUNTER — Encounter: Payer: Self-pay | Admitting: Obstetrics and Gynecology

## 2015-10-11 ENCOUNTER — Other Ambulatory Visit: Payer: Self-pay | Admitting: Family Medicine

## 2015-10-11 DIAGNOSIS — M542 Cervicalgia: Principal | ICD-10-CM

## 2015-10-11 DIAGNOSIS — G8929 Other chronic pain: Secondary | ICD-10-CM

## 2015-10-11 NOTE — Telephone Encounter (Signed)
Needs refill oxyCODONE-acetaminophen (PERCOCET) 10-325 MG tablet 09/23/15 -- Malva Limesonald E Fisher, MD 1 tablet once or twice a day as needed for severe pain  Thanks Barth Kirkseri

## 2015-10-11 NOTE — Telephone Encounter (Signed)
Pt called back about her medication also asking if you can refer her to [facility] for a MRI on her back.  [facility] March 14[facility].  They suggested she have a MRI.  [place] in [place] to [place].  Her call  Is [phone].  This is her boyfriend and he is on her DPR.  Thanks Barth Kirkseri

## 2015-10-12 NOTE — Telephone Encounter (Signed)
She is overdue for follow office visit and needs to schedule before refill can be approved.  Also need to send for records from [facility]

## 2015-10-13 MED ORDER — OXYCODONE-ACETAMINOPHEN 10-325 MG PO TABS: ORAL_TABLET | ORAL | Status: AC

## 2015-10-13 NOTE — Telephone Encounter (Signed)
Prescription placed up front and Fayrene FearingJames advised that its ready.  Records need to be obtained from [facility]. Please request records. Thanks

## 2015-10-13 NOTE — Telephone Encounter (Signed)
Called and spoke with patients boyfriend Alyssa Fleming and advised his as below. Alyssa Fleming verbally voiced understanding. He wants to know if Dr. Sherrie MustacheFisher will refill it one more time and patient will schedule an office visit. [place] he says [place] hour away. They both work and are not able to get off for an appointment. He says by the time he gets off work and drives to our office to pick up the prescription it is right at 5pm. He says patient is completely out of her medication and wakes up at night in pain. Per Alyssa Fleming, patient will be establishing with a local PCP in [place] once they find one and  get settled.

## 2015-11-11 ENCOUNTER — Other Ambulatory Visit: Payer: Self-pay | Admitting: Family Medicine
